# Patient Record
Sex: Male | Born: 1995 | Race: Black or African American | Hispanic: No | Marital: Single | Smoking: Never smoker
Health system: Southern US, Community
[De-identification: ages and names within clinical notes are randomized; demographics above are authoritative.]

## PROBLEM LIST (undated history)

## (undated) DIAGNOSIS — S022XXA Fracture of nasal bones, initial encounter for closed fracture: Secondary | ICD-10-CM

## (undated) DIAGNOSIS — S060XAA Concussion with loss of consciousness status unknown, initial encounter: Secondary | ICD-10-CM

## (undated) DIAGNOSIS — S42009A Fracture of unspecified part of unspecified clavicle, initial encounter for closed fracture: Secondary | ICD-10-CM

## (undated) DIAGNOSIS — S060X9A Concussion with loss of consciousness of unspecified duration, initial encounter: Secondary | ICD-10-CM

## (undated) DIAGNOSIS — G43909 Migraine, unspecified, not intractable, without status migrainosus: Secondary | ICD-10-CM

## (undated) HISTORY — PX: ADENOIDECTOMY: SUR15

---
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
## 2013-02-19 IMAGING — CR DG NASAL BONES 3+V
3 series · 3 of 3 positions shown · non-contrast
Comparison: None.

CLINICAL DATA: Nasal injury.

NASAL BONES - 3+ VIEW

[w waters pa]
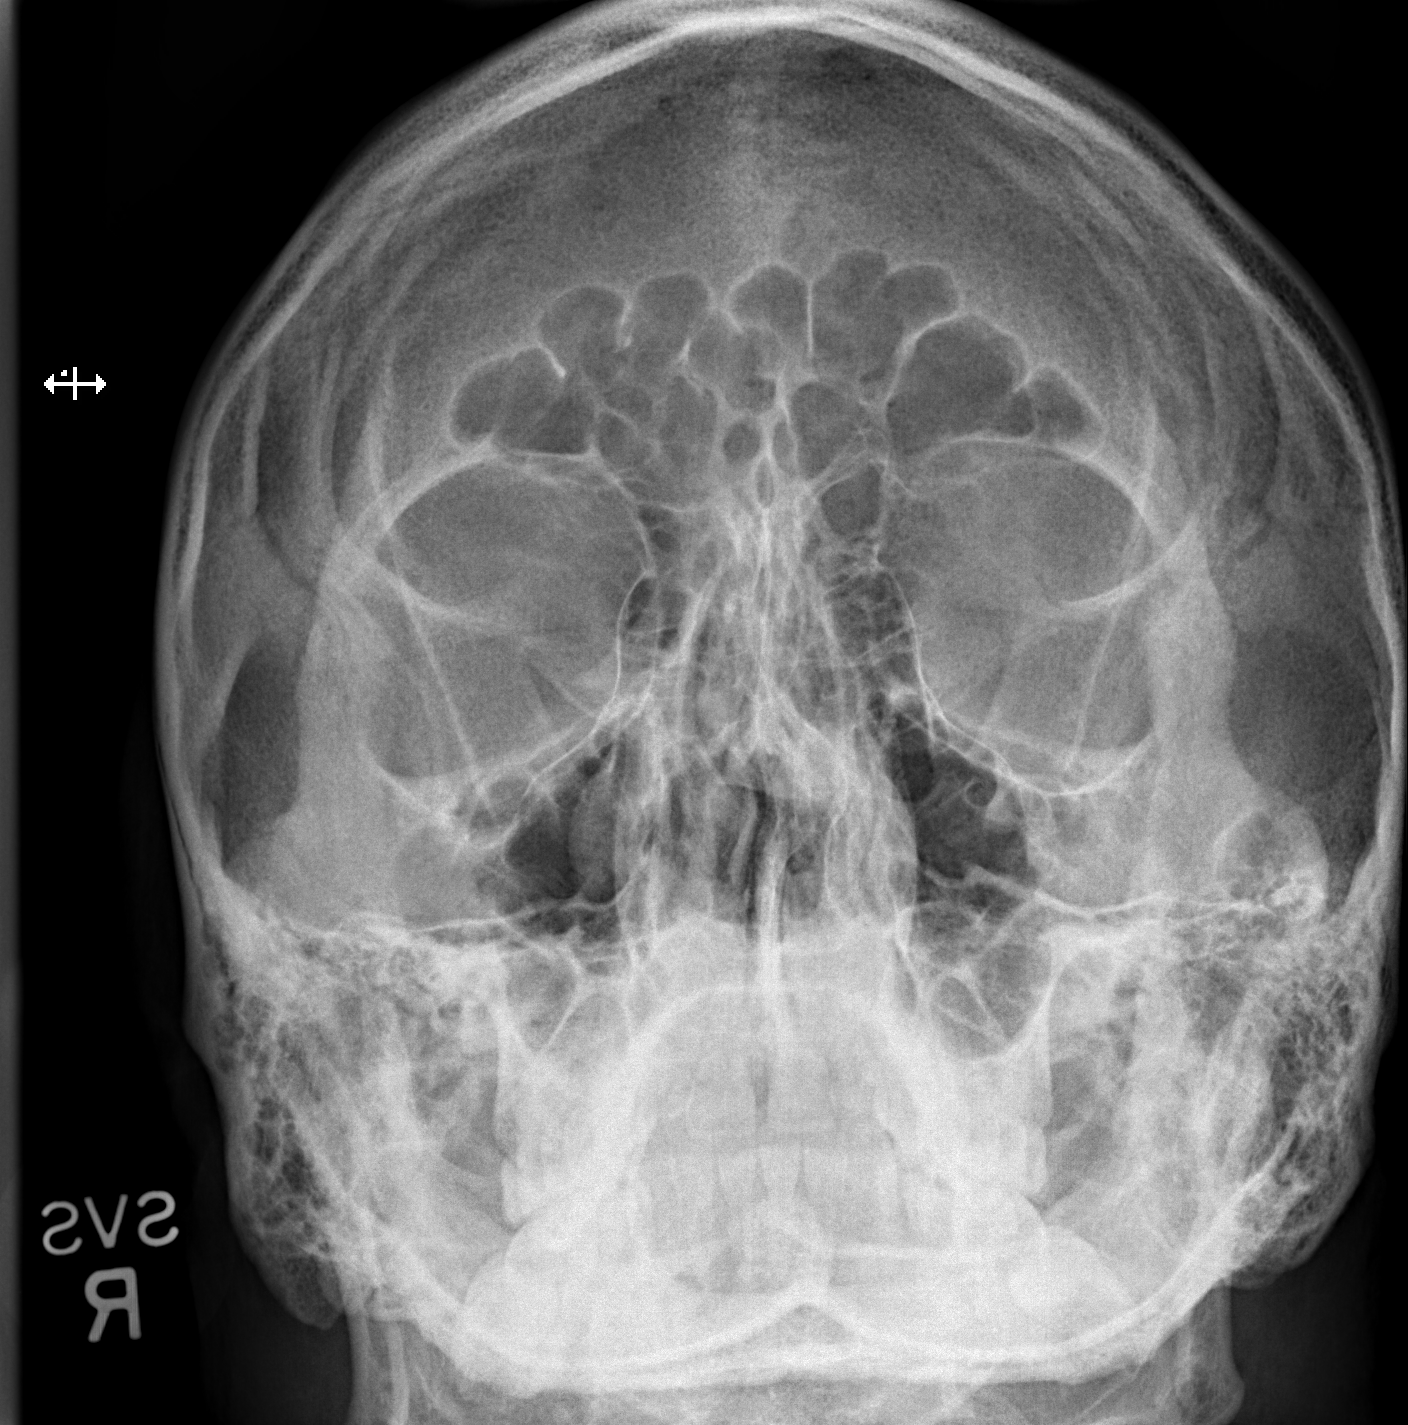

[w nasal bone lat (1 of 2)]
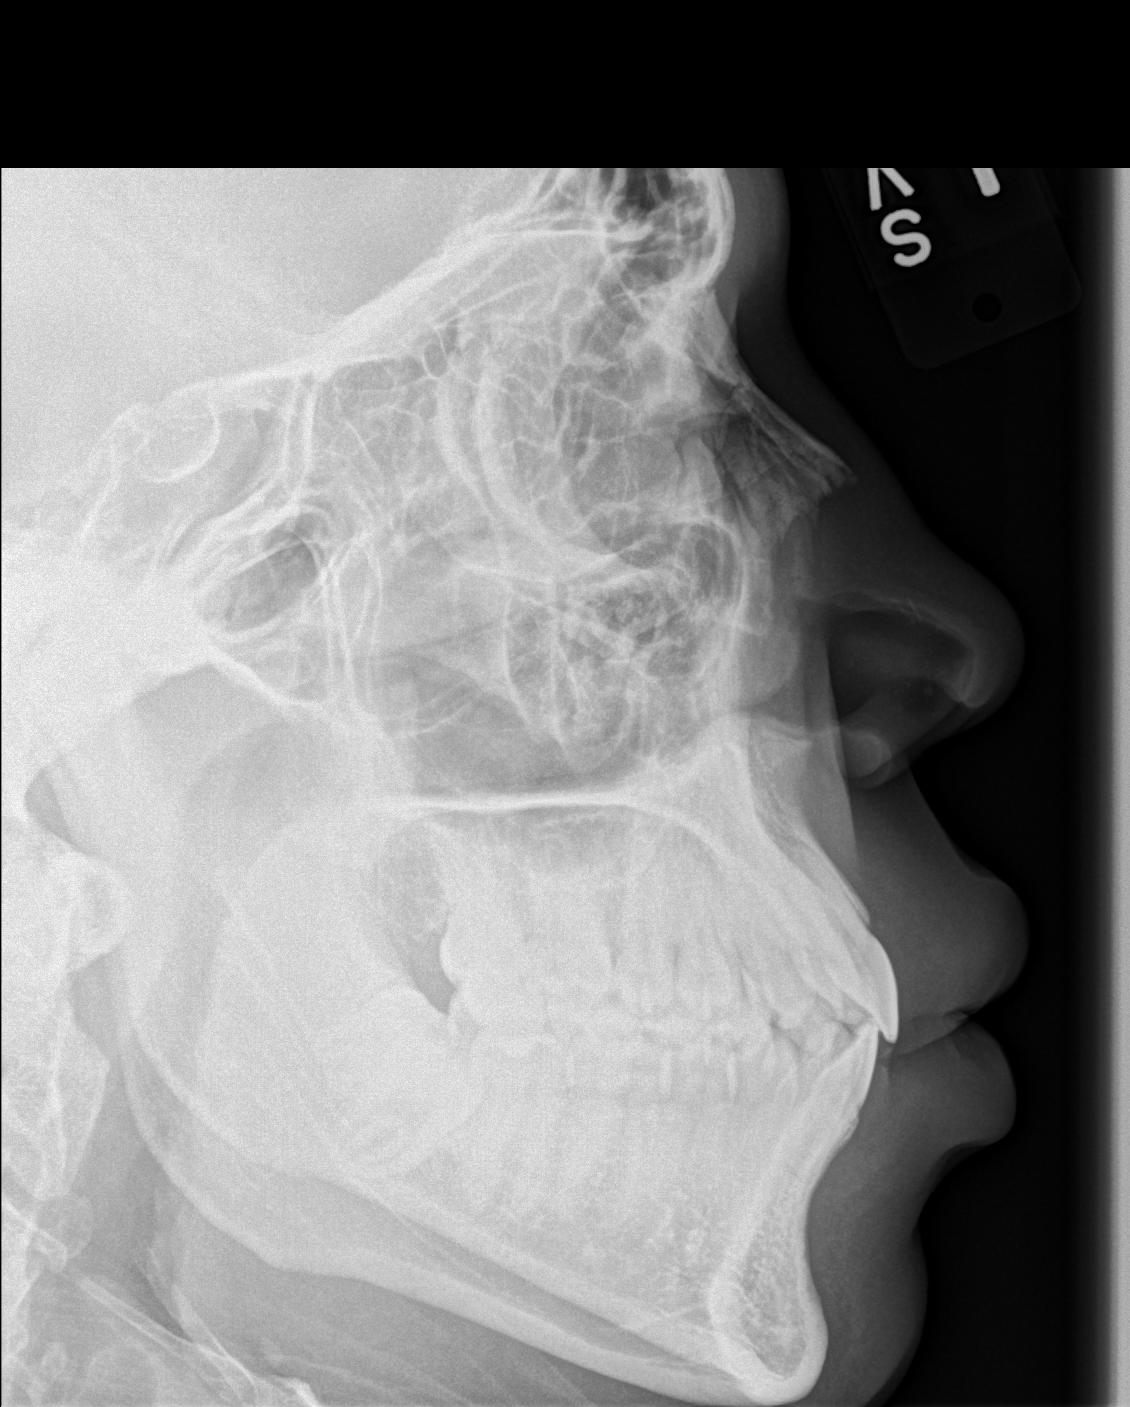

[w nasal bone lat (2 of 2)]
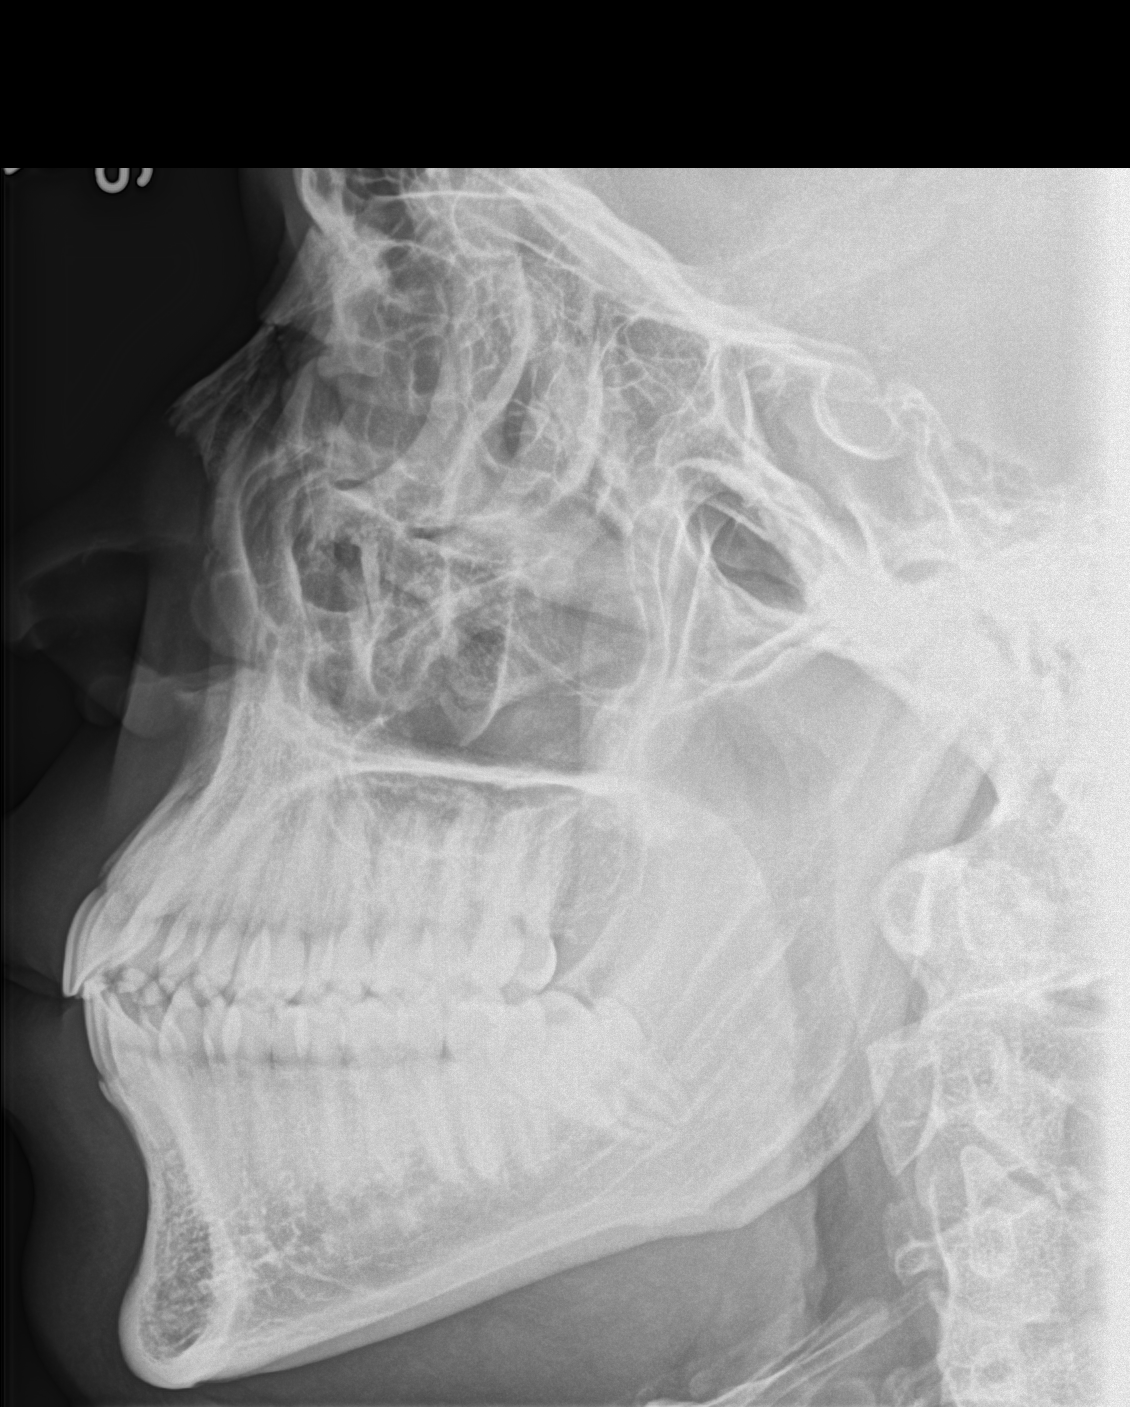

[3 of 3 positions shown; findings below may reference images not displayed]

FINDINGS: Left lateral view shows slight step-off at the bridge of
the nose along the superior margin of the nasal bone.  This may
represents a minimally displaced fracture.
IMPRESSION: Possible minimally displaced upper nasal bone fracture which is
more evident on the left lateral view than the right lateral view.

## 2014-03-18 ENCOUNTER — Encounter (HOSPITAL_COMMUNITY): Payer: Self-pay | Admitting: Emergency Medicine

## 2014-03-18 ENCOUNTER — Emergency Department (HOSPITAL_COMMUNITY)
Admission: EM | Admit: 2014-03-18 | Discharge: 2014-03-18 | Disposition: A | Discharge status: Home / Self Care | Payer: Federal, State, Local not specified - PPO | Attending: Emergency Medicine | Admitting: Emergency Medicine

## 2014-03-18 DIAGNOSIS — L255 Unspecified contact dermatitis due to plants, except food: Secondary | ICD-10-CM

## 2014-03-18 DIAGNOSIS — Z8781 Personal history of (healed) traumatic fracture: Secondary | ICD-10-CM | POA: Insufficient documentation

## 2014-03-18 DIAGNOSIS — Z8679 Personal history of other diseases of the circulatory system: Secondary | ICD-10-CM | POA: Insufficient documentation

## 2014-03-18 DIAGNOSIS — Z87828 Personal history of other (healed) physical injury and trauma: Secondary | ICD-10-CM | POA: Insufficient documentation

## 2014-03-18 HISTORY — DX: Migraine, unspecified, not intractable, without status migrainosus: G43.909

## 2014-03-18 MED ORDER — LORATADINE 10 MG PO TABS: 10.0000 mg | ORAL_TABLET | Freq: Every day | ORAL | Status: DC

## 2014-03-18 MED ORDER — HYDROCORTISONE 1 % EX CREA: TOPICAL_CREAM | CUTANEOUS | Status: DC

## 2014-03-18 NOTE — ED Notes (Addendum)
C/o bilateral medial ankle rash, linear rash noted with redness, swelling & oozing. C/o itching. First noticed Sunday morning. Worked in yard pulling vines Friday afternoon. Here thinking it was an insect bite.

## 2014-03-18 NOTE — Discharge Instructions (Signed)
Poison Ivy °Poison ivy is a rash caused by touching the leaves of the poison ivy plant. The rash often shows up 48 hours later. You might just have bumps, redness, and itching. Sometimes, blisters appear and break open. Your eyes may get puffy (swollen). Poison ivy often heals in 2 to 3 weeks without treatment. °HOME CARE °· If you touch poison ivy: °· Wash your skin with soap and water right away. Wash under your fingernails. Do not rub the skin very hard. °· Wash any clothes you were wearing. °· Avoid poison ivy in the future. Poison ivy has 3 leaves on a stem. °· Use medicine to help with itching as told by your doctor. Do not drive when you take this medicine. °· Keep open sores dry, clean, and covered with a bandage and medicated cream, if needed. °· Ask your doctor about medicine for children. °GET HELP RIGHT AWAY IF: °· You have open sores. °· Redness spreads beyond the area of the rash. °· There is yellowish white fluid (pus) coming from the rash. °· Pain gets worse. °· You have a temperature by mouth above 102° F (38.9° C), not controlled by medicine. °MAKE SURE YOU: °· Understand these instructions. °· Will watch your condition. °· Will get help right away if you are not doing well or get worse. °Document Released: 11/04/2010 Document Revised: 12/25/2011 Document Reviewed: 11/04/2010 °Exit[employer]® Patient Information ©2014 [employer]. ° °

## 2014-03-18 NOTE — ED Notes (Signed)
Pt denies pain but does state that bilat legs are itchy.

## 2014-03-18 NOTE — ED Provider Notes (Signed)
CSN: [id number]     Arrival date & time 03/18/14  2105 History   First MD Initiated Contact with Patient 03/18/14 2212     Chief Complaint  Patient presents with  . Rash   Patient is a 18 y.o. male presenting with rash. The history is provided by the patient. No language interpreter was used.  Rash Location:  Leg Leg rash location:  L lower leg and R lower leg Quality: blistering, draining, itchiness and weeping   Quality: not burning, not painful, not red and not swelling   Severity:  Mild Onset quality:  Gradual Duration:  4 days Timing:  Constant Progression:  Unchanged Context: plant contact   Context: not animal contact, not chemical exposure, not exposure to similar rash, not insect bite/sting, not new detergent/soap, not pollen, not sick contacts and not sun exposure   Relieved by:  Nothing Worsened by:  Nothing tried Ineffective treatments:  None tried Associated symptoms: no fatigue, no fever, no induration, no joint pain, no shortness of breath and not wheezing     Past Medical History  Diagnosis Date  . Concussion   . Broken clavicle   . Nasal fracture   . Migraines    Past Surgical History  Procedure Laterality Date  . Tonsillectomy and adenoidectomy  03/03/2011  . Adenoidectomy      as child  . Closed reduction nasal fracture  09/27/2012    Procedure: CLOSED REDUCTION NASAL FRACTURE;  Surgeon: Osborn Coho, MD;  Location: [facility];  Service: ENT;  Laterality: N/A;   No family history on file. History  Substance Use Topics  . Smoking status: Never Smoker   . Smokeless tobacco: Not on file  . Alcohol Use: No    Review of Systems  Constitutional: Negative for fever, chills and fatigue.  Respiratory: Negative for chest tightness, shortness of breath and wheezing.   Cardiovascular: Negative for chest pain.  Musculoskeletal: Negative for arthralgias.  Skin: Positive for rash. Negative for color change.  All other systems reviewed and are  negative.     Allergies  Review of patient's allergies indicates no known allergies.  Home Medications   Prior to Admission medications   Medication Sig Start Date End Date Taking? Authorizing Provider  ibuprofen (ADVIL,MOTRIN) 200 MG tablet Take 400 mg by mouth every 6 (six) hours as needed. For headache.   Yes Historical Provider, MD   BP 120/68  Pulse 65  Temp(Src) 97.9 F (36.6 C) (Oral)  Resp 20  SpO2 99% Physical Exam  Nursing note and vitals reviewed. Constitutional: He is oriented to person, place, and time. He appears well-developed and well-nourished.  HENT:  Head: Normocephalic and atraumatic.  Eyes: Conjunctivae are normal. No scleral icterus.  Neck: Normal range of motion. No tracheal deviation present.  Cardiovascular: Normal rate, regular rhythm, normal heart sounds and intact distal pulses.  Exam reveals no gallop and no friction rub.   No murmur heard. Pulmonary/Chest: Effort normal and breath sounds normal. No respiratory distress. He has no wheezes. He has no rales. He exhibits no tenderness.  Musculoskeletal: Normal range of motion.  Neurological: He is alert and oriented to person, place, and time.  Skin: Skin is warm and dry.  Multiple vesicles located over the bilateral medial ankle with some ruptured vesicles that are actively weeping a clear non-purulent non-bloody discharge.  Vesicles are on a mildly erythematous base without induration.  Rash has no petchiae, purpura, and blanches with pressure.  It is not noted over  the soles of the feet.  Psychiatric: He has a normal mood and affect. His behavior is normal. Judgment and thought content normal.    ED Course  Procedures (including critical care time) Labs Review Labs Reviewed - No data to display  Imaging Review No results found.   EKG Interpretation None      MDM   Final diagnoses:  Contact dermatitis due to plant    Patient presents with history of rash onset after gardening and  pulling up weeds.  Rash is localized and appears consistent with poison ivy.  Will treat with oral claritin for itching and will use topical hydrocortisone.  Patient was instructed to not itch this rash and to keep it clean and dry.  He can use gauze to cover it as he needs it for weeping.  Strict return precautions of worsening of symptoms, wheezing, fever, or new onset of pain.  Patient understands and agrees with current plan.  Will discharge home.    Clydie Braunourtney A Forucci, PA-C 03/18/14 2237

## 2014-03-19 NOTE — ED Provider Notes (Signed)
Medical screening examination/treatment/procedure(s) were performed by non-physician practitioner and as supervising physician I was immediately available for consultation/collaboration.   EKG Interpretation None        Katesha Eichel S Marit Goodwill, MD 03/19/14 1103 

## 2019-08-08 ENCOUNTER — Other Ambulatory Visit: Payer: Self-pay

## 2019-08-08 DIAGNOSIS — Z20822 Contact with and (suspected) exposure to covid-19: Secondary | ICD-10-CM

## 2019-08-09 LAB — NOVEL CORONAVIRUS, NAA: SARS-CoV-2, NAA: NOT DETECTED

## 2023-03-21 ENCOUNTER — Ambulatory Visit: Payer: 59 | Admitting: Podiatry

## 2023-03-21 ENCOUNTER — Encounter: Payer: Self-pay | Admitting: Podiatry

## 2023-03-21 DIAGNOSIS — L600 Ingrowing nail: Secondary | ICD-10-CM

## 2023-03-21 NOTE — Patient Instructions (Signed)

## 2023-03-22 NOTE — Progress Notes (Signed)
Subjective:   Patient ID: Antonio Ali, male   DOB: 27 y.o.   MRN: [id number]   HPI Patient presents stating he has had a chronic ingrown toenail of his left big toe that is been sore and making it hard for him to wear shoe gear properly.  He has tried to soak it trim it is been present for a number of months   Review of Systems  All other systems reviewed and are negative.       Objective:  Physical Exam Vitals and nursing note reviewed.  Constitutional:      Appearance: He is well-developed.  Pulmonary:     Effort: Pulmonary effort is normal.  Musculoskeletal:        General: Normal range of motion.  Skin:    General: Skin is warm.  Neurological:     Mental Status: He is alert.     Neurovascular status intact muscle strength adequate range of motion adequate with incurvation of the left hallux lateral border that is very painful when pressed and is making wearing shoe gear difficult.  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  Explained procedure risk patient wants surgery and today I went ahead and I had him read then signed consent form.  Infiltrated the left big toe 60 mg Xylocaine Marcaine mixture and sterile prep applied using sterile instrumentation remove the lateral border exposed matrix applied phenol 3 applications 30 seconds followed by alcohol lavage sterile dressing gave instructions on soaks and to wear dressing 24 hours take it off earlier if throbbing were to occur and encouraged him to call questions concerns which may arise

## 2023-11-14 ENCOUNTER — Ambulatory Visit
Admission: EM | Admit: 2023-11-14 | Discharge: 2023-11-14 | Disposition: A | Discharge status: Home / Self Care | Payer: 59 | Attending: Physician Assistant | Admitting: Physician Assistant

## 2023-11-14 DIAGNOSIS — J111 Influenza due to unidentified influenza virus with other respiratory manifestations: Secondary | ICD-10-CM

## 2023-11-14 LAB — POCT INFLUENZA A/B
Influenza A, POC: POSITIVE — AB
Influenza B, POC: NEGATIVE

## 2023-11-14 MED ORDER — ACETAMINOPHEN 325 MG PO TABS
650.0000 mg | ORAL_TABLET | Freq: Once | ORAL | Status: AC
  Administered 2023-11-14: 650 mg via ORAL

## 2023-11-14 NOTE — ED Provider Notes (Signed)
[facility]    CSN: [id number] Arrival date & time: 11/14/23  0854      History   Chief Complaint Chief Complaint  Patient presents with   Cough   Headache   Chest Pain   Fever    HPI Antonio Ali is a 28 y.o. male.   Patient complains of a cough and congestion.  Patient reports he has had a fever.  Patient has been sick for the past 3 days.  Patient denies any shortness of breath.  The history is provided by the patient. No language interpreter was used.  Cough Associated symptoms: chest pain, fever and headaches   Headache Associated symptoms: cough and fever   Chest Pain Associated symptoms: cough, fever and headache   Fever Associated symptoms: chest pain, cough and headaches     Past Medical History:  Diagnosis Date   Broken clavicle    Concussion    Migraines    Nasal fracture     Patient Active Problem List   Diagnosis Date Noted   Nasal fracture 09/27/2012    Class: Acute    Past Surgical History:  Procedure Laterality Date   ADENOIDECTOMY     as child   CLOSED REDUCTION NASAL FRACTURE  09/27/2012   Procedure: CLOSED REDUCTION NASAL FRACTURE;  Surgeon: Osborn Coho, MD;  Location: [facility];  Service: ENT;  Laterality: N/A;   TONSILLECTOMY AND ADENOIDECTOMY  03/03/2011       Home Medications    Prior to Admission medications   Medication Sig Start Date End Date Taking? Authorizing Provider  hydrocortisone cream 1 % Apply to affected area 2 times daily 03/18/14   Shirleen Schirmer, PA-C  ibuprofen (ADVIL,MOTRIN) 200 MG tablet Take 400 mg by mouth every 6 (six) hours as needed. For headache.    [provider]  loratadine (CLARITIN) 10 MG tablet Take 1 tablet (10 mg total) by mouth daily. 03/18/14   Shirleen Schirmer, PA-C    Family History History reviewed. No pertinent family history.  Social History Social History   Tobacco Use   Smoking status: Never  Substance Use Topics   Alcohol use: No    Drug use: No     Allergies   Patient has no known allergies.   Review of Systems Review of Systems  Constitutional:  Positive for fever.  Respiratory:  Positive for cough.   Cardiovascular:  Positive for chest pain.  Neurological:  Positive for headaches.  All other systems reviewed and are negative.    Physical Exam Triage Vital Signs ED Triage Vitals  Encounter Vitals Group     BP 11/14/23 0922 132/85     Systolic BP Percentile --      Diastolic BP Percentile --      Pulse Rate 11/14/23 0922 (!) 101     Resp 11/14/23 0922 19     Temp 11/14/23 0922 (!) 101.2 F (38.4 C)     Temp Source 11/14/23 0922 Oral     SpO2 11/14/23 0922 93 %     Weight --      Height --      Head Circumference --      Peak Flow --      Pain Score 11/14/23 0921 0     Pain Loc --      Pain Education --      Exclude from Growth Chart --    No data found.  Updated Vital Signs BP 132/85   Pulse Marland Kitchen)  101   Temp (!) 101 F (38.3 C) (Oral)   Resp 19   SpO2 93%   Visual Acuity Right Eye Distance:   Left Eye Distance:   Bilateral Distance:    Right Eye Near:   Left Eye Near:    Bilateral Near:     Physical Exam Vitals and nursing note reviewed.  Constitutional:      General: He is not in acute distress.    Appearance: He is well-developed.  HENT:     Head: Normocephalic and atraumatic.  Eyes:     Conjunctiva/sclera: Conjunctivae normal.  Cardiovascular:     Rate and Rhythm: Normal rate and regular rhythm.     Heart sounds: No murmur heard. Pulmonary:     Effort: Pulmonary effort is normal. No respiratory distress.     Breath sounds: Normal breath sounds.  Abdominal:     Palpations: Abdomen is soft.     Tenderness: There is no abdominal tenderness.  Musculoskeletal:        General: No swelling.     Cervical back: Neck supple.  Skin:    General: Skin is warm and dry.     Capillary Refill: Capillary refill takes less than 2 seconds.  Neurological:     Mental Status: He is  alert.  Psychiatric:        Mood and Affect: Mood normal.      UC Treatments / Results  Labs (all labs ordered are listed, but only abnormal results are displayed) Labs Reviewed  POCT INFLUENZA A/B - Abnormal; Notable for the following components:      Result Value   Influenza A, POC Positive (*)    All other components within normal limits    EKG   Radiology No results found.  Procedures Procedures (including critical care time)  Medications Ordered in UC Medications  acetaminophen (TYLENOL) tablet 650 mg (650 mg Oral Given 11/14/23 0931)    Initial Impression / Assessment and Plan / UC Course  I have reviewed the triage vital signs and the nursing notes.  Pertinent labs & imaging results that were available during my care of the patient were reviewed by me and considered in my medical decision making (see chart for details).     Influenza A is positive Final Clinical Impressions(s) / UC Diagnoses   Final diagnoses:  Influenza with respiratory manifestation     Discharge Instructions      Return if any problems.     ED Prescriptions   None    PDMP not reviewed this encounter. An After Visit Summary was printed and given to the patient.       Elson Areas, [place] 11/14/23 1040

## 2023-11-14 NOTE — Discharge Instructions (Signed)
Return if any problems.

## 2023-11-14 NOTE — ED Triage Notes (Signed)
Pt presents with c/o headaches, fatigue.  Reports he developed cough, fever and chest congestion X 3 days.

## 2024-10-12 ENCOUNTER — Ambulatory Visit: Admission: EM | Admit: 2024-10-12 | Discharge: 2024-10-12 | Disposition: A | Discharge status: Home / Self Care

## 2024-10-12 DIAGNOSIS — K529 Noninfective gastroenteritis and colitis, unspecified: Secondary | ICD-10-CM

## 2024-10-12 LAB — POCT INFLUENZA A/B: Influenza A, POC: NEGATIVE

## 2024-10-12 LAB — POC SOFIA SARS ANTIGEN FIA: SARS Coronavirus 2 Ag: NEGATIVE

## 2024-10-12 MED ORDER — ONDANSETRON 8 MG PO TBDP: 8.0000 mg | ORAL_TABLET | Freq: Three times a day (TID) | ORAL | 0 refills | Status: DC | PRN

## 2024-10-12 NOTE — Discharge Instructions (Addendum)

## 2024-10-12 NOTE — ED Provider Notes (Signed)
 " [facility]    CSN: [id number] Arrival date & time: 10/12/24  1028      History   Chief Complaint No chief complaint on file.   HPI Antonio Ali is a 28 y.o. male.   Pt presents today due to nausea, vomiting, and diarrhea that started on Sunday, 12 hrs after eating at his girlfriend's Christmas party. Pt states that he was doing better by Christmas. Pt states that he drank coffee yesterday and believe that caused him to experience chills and stomach upset again. Pt denies any URI symptoms. Pt denies vomiting or diarrhea in the last 2 days either. Pt states that he use Pepto Bismol last night with some relief.   The history is provided by the patient.    Past Medical History:  Diagnosis Date   Broken clavicle    Concussion    Migraines    Nasal fracture     Patient Active Problem List   Diagnosis Date Noted   Nasal fracture 09/27/2012    Class: Acute    Past Surgical History:  Procedure Laterality Date   ADENOIDECTOMY     as child   CLOSED REDUCTION NASAL FRACTURE  09/27/2012   Procedure: CLOSED REDUCTION NASAL FRACTURE;  Surgeon: Alm Bouche, MD;  Location: [facility];  Service: ENT;  Laterality: N/A;   TONSILLECTOMY AND ADENOIDECTOMY  03/03/2011       Home Medications    Prior to Admission medications  Medication Sig Start Date End Date Taking? Authorizing Provider  hydrocortisone  cream 1 % Apply to affected area 2 times daily 03/18/14   Aniceto Pfeiffer, PA-C  ibuprofen (ADVIL,MOTRIN) 200 MG tablet Take 400 mg by mouth every 6 (six) hours as needed. For headache.    [provider]  loratadine  (CLARITIN ) 10 MG tablet Take 1 tablet (10 mg total) by mouth daily. 03/18/14   Aniceto Pfeiffer, PA-C    Family History No family history on file.  Social History Social History[1]   Allergies   Patient has no known allergies.   Review of Systems Review of Systems   Physical Exam Triage Vital Signs ED Triage  Vitals  Encounter Vitals Group     BP 10/12/24 1235 133/88     Girls Systolic BP Percentile --      Girls Diastolic BP Percentile --      Boys Systolic BP Percentile --      Boys Diastolic BP Percentile --      Pulse Rate 10/12/24 1235 94     Resp 10/12/24 1235 20     Temp 10/12/24 1235 99.5 F (37.5 C)     Temp Source 10/12/24 1235 Oral     SpO2 10/12/24 1235 100 %     Weight 10/12/24 1233 260 lb (117.9 kg)     Height 10/12/24 1233 6' (1.829 m)     Head Circumference --      Peak Flow --      Pain Score 10/12/24 1233 1     Pain Loc --      Pain Education --      Exclude from Growth Chart --    No data found.  Updated Vital Signs BP 133/88 (BP Location: Left Arm)   Pulse 94   Temp 99.5 F (37.5 C) (Oral)   Resp 20   Ht 6' (1.829 m)   Wt 260 lb (117.9 kg)   SpO2 100%   BMI 35.26 kg/m   Visual Acuity Right Eye  Distance:   Left Eye Distance:   Bilateral Distance:    Right Eye Near:   Left Eye Near:    Bilateral Near:     Physical Exam Vitals and nursing note reviewed.  Constitutional:      General: He is not in acute distress.    Appearance: Normal appearance. He is not ill-appearing, toxic-appearing or diaphoretic.  Eyes:     General: No scleral icterus. Cardiovascular:     Rate and Rhythm: Normal rate and regular rhythm.     Heart sounds: Normal heart sounds.  Pulmonary:     Effort: Pulmonary effort is normal. No respiratory distress.     Breath sounds: Normal breath sounds. No wheezing or rhonchi.  Abdominal:     General: Abdomen is flat. Bowel sounds are normal.     Palpations: Abdomen is soft.     Tenderness: There is no abdominal tenderness.  Skin:    General: Skin is warm.  Neurological:     Mental Status: He is alert and oriented to person, place, and time.  Psychiatric:        Mood and Affect: Mood normal.        Behavior: Behavior normal.      UC Treatments / Results  Labs (all labs ordered are listed, but only abnormal results are  displayed) Labs Reviewed  POCT INFLUENZA A/B  POC SOFIA SARS ANTIGEN FIA    EKG   Radiology No results found.  Procedures Procedures (including critical care time)  Medications Ordered in UC Medications - No data to display  Initial Impression / Assessment and Plan / UC Course  I have reviewed the triage vital signs and the nursing notes.  Pertinent labs & imaging results that were available during my care of the patient were reviewed by me and considered in my medical decision making (see chart for details).    Final Clinical Impressions(s) / UC Diagnoses   Final diagnoses:  None   Discharge Instructions   None    ED Prescriptions   None    PDMP not reviewed this encounter.    [1]  Social History Tobacco Use   Smoking status: Never  Substance Use Topics   Alcohol use: No   Drug use: Yes    Types: Marijuana    Comment: THC gummies     Andra Corean BROCKS, PA-C 10/12/24 1305  "

## 2024-10-12 NOTE — ED Triage Notes (Signed)
 Antonio Ali presents with chills, headache, diarrhea earlier this week. Treated with Pepto, anti gas and diarrhea and Tylenol  Pm. I was feeling better for 2 days.

## 2024-10-19 ENCOUNTER — Ambulatory Visit
Admission: RE | Admit: 2024-10-19 | Discharge: 2024-10-19 | Disposition: A | Discharge status: Home / Self Care | Source: Ambulatory Visit | Attending: Internal Medicine | Admitting: Internal Medicine

## 2024-10-19 VITALS — BP 154/96 | HR 67 | Temp 98.0°F | Resp 18

## 2024-10-19 DIAGNOSIS — M79604 Pain in right leg: Secondary | ICD-10-CM | POA: Diagnosis not present

## 2024-10-19 DIAGNOSIS — M7989 Other specified soft tissue disorders: Secondary | ICD-10-CM

## 2024-10-19 NOTE — ED Triage Notes (Signed)
 Pt began have pain in right groin last Sunday then moved to right knee and now pain is in right calf. Pt is having pain in right calf, swelling, redness, and warmth.   Denies any injury

## 2024-10-19 NOTE — ED Provider Notes (Signed)
 [facility]    CSN: [id number] Arrival date & time: 10/19/24  9161      History   Chief Complaint Chief Complaint  Patient presents with   Ankle Pain    My lower left leg is swollen from the knee down. It's tender and hurt when touch. - Entered by patient    HPI Antonio Ali is a 29 y.o. male.   29 year old male who presents urgent care with complaints of right lower extremity pain and swelling.  He reports that last Sunday he did start to have some pain in his groin.  This slowly progressed to more pain in his knee and in the last 2 to 3 days he has developed swelling that is just below the knee.  He has having a lot of pain in the calf as well.  He denies any history of this in the past.  He is not having any swelling on the left side.  He denies any recent surgeries, significant illnesses, clotting disorders or other risk factors for deep vein thrombosis.   Ankle Pain Associated symptoms: no back pain and no fever     Past Medical History:  Diagnosis Date   Broken clavicle    Concussion    Migraines    Nasal fracture     Patient Active Problem List   Diagnosis Date Noted   Nasal fracture 09/27/2012    Class: Acute    Past Surgical History:  Procedure Laterality Date   ADENOIDECTOMY     as child   CLOSED REDUCTION NASAL FRACTURE  09/27/2012   Procedure: CLOSED REDUCTION NASAL FRACTURE;  Surgeon: Alm Bouche, MD;  Location: [facility];  Service: ENT;  Laterality: N/A;   TONSILLECTOMY AND ADENOIDECTOMY  03/03/2011       Home Medications    Prior to Admission medications  Medication Sig Start Date End Date Taking? Authorizing Provider  hydrocortisone  cream 1 % Apply to affected area 2 times daily 03/18/14   Aniceto Pfeiffer, PA-C  ibuprofen (ADVIL,MOTRIN) 200 MG tablet Take 400 mg by mouth every 6 (six) hours as needed. For headache.    [provider]  loratadine  (CLARITIN ) 10 MG tablet Take 1 tablet (10 mg total) by  mouth daily. 03/18/14   Aniceto Pfeiffer, PA-C  ondansetron  (ZOFRAN -ODT) 8 MG disintegrating tablet Take 1 tablet (8 mg total) by mouth every 8 (eight) hours as needed. 10/12/24   Andra Corean BROCKS, PA-C    Family History History reviewed. No pertinent family history.  Social History Social History[1]   Allergies   Patient has no known allergies.   Review of Systems Review of Systems  Constitutional:  Negative for chills and fever.  HENT:  Negative for ear pain and sore throat.   Eyes:  Negative for pain and visual disturbance.  Respiratory:  Negative for cough and shortness of breath.   Cardiovascular:  Negative for chest pain and palpitations.  Gastrointestinal:  Negative for abdominal pain and vomiting.  Genitourinary:  Negative for dysuria and hematuria.  Musculoskeletal:  Negative for arthralgias and back pain.       Right lower extremity pain and swelling  Skin:  Negative for color change and rash.  Neurological:  Negative for seizures and syncope.  All other systems reviewed and are negative.    Physical Exam Triage Vital Signs ED Triage Vitals  Encounter Vitals Group     BP 10/19/24 0859 (!) 154/96     Girls Systolic BP Percentile --  Girls Diastolic BP Percentile --      Boys Systolic BP Percentile --      Boys Diastolic BP Percentile --      Pulse Rate 10/19/24 0859 67     Resp 10/19/24 0859 18     Temp 10/19/24 0859 98 F (36.7 C)     Temp Source 10/19/24 0859 Oral     SpO2 10/19/24 0859 95 %     Weight --      Height --      Head Circumference --      Peak Flow --      Pain Score 10/19/24 0906 5     Pain Loc --      Pain Education --      Exclude from Growth Chart --    No data found.  Updated Vital Signs BP (!) 154/96 (BP Location: Right Arm)   Pulse 67   Temp 98 F (36.7 C) (Oral)   Resp 18   SpO2 95%   Visual Acuity Right Eye Distance:   Left Eye Distance:   Bilateral Distance:    Right Eye Near:   Left Eye Near:     Bilateral Near:     Physical Exam Vitals and nursing note reviewed.  Constitutional:      General: He is not in acute distress.    Appearance: He is well-developed.  HENT:     Head: Normocephalic and atraumatic.  Eyes:     Conjunctiva/sclera: Conjunctivae normal.  Cardiovascular:     Rate and Rhythm: Normal rate and regular rhythm.     Pulses:          Dorsalis pedis pulses are 2+ on the right side.       Posterior tibial pulses are 2+ on the right side.     Heart sounds: No murmur heard.    Comments: Tenderness to palpation in the calf on the right lower extremity Pulmonary:     Effort: Pulmonary effort is normal. No respiratory distress.     Breath sounds: Normal breath sounds.  Abdominal:     Palpations: Abdomen is soft.     Tenderness: There is no abdominal tenderness.  Musculoskeletal:        General: No swelling.     Cervical back: Neck supple.     Right lower leg: 2+ Edema present.  Skin:    General: Skin is warm and dry.     Capillary Refill: Capillary refill takes less than 2 seconds.  Neurological:     Mental Status: He is alert.  Psychiatric:        Mood and Affect: Mood normal.      UC Treatments / Results  Labs (all labs ordered are listed, but only abnormal results are displayed) Labs Reviewed - No data to display  EKG   Radiology No results found.  Procedures Procedures (including critical care time)  Medications Ordered in UC Medications - No data to display  Initial Impression / Assessment and Plan / UC Course  I have reviewed the triage vital signs and the nursing notes.  Pertinent labs & imaging results that were available during my care of the patient were reviewed by me and considered in my medical decision making (see chart for details).     Pain and swelling of right lower extremity   Right lower extremity pain and swelling.  Presentation is concerning for possible deep vein thrombosis although risk factors are very low  including no recent surgeries,  significant illnesses or clotting disorders.  Given this we will schedule for an ultrasound tomorrow and pending these results, further treatment recommendations will be made.  If there is a deep vein thrombosis (blood clot) then anticoagulant therapy will be started (blood thinners).  If no deep vein thrombosis is present, then may need to consider infectious process as possible cause although this seems to be less likely.  For now, monitor your symptoms and if you develop shortness of breath or chest pain recommend going to the emergency room immediately for further evaluation and not waiting till the ultrasound tomorrow.  Instructions for the ultrasound are attached.  Keep the leg elevated to help with swelling.  Final Clinical Impressions(s) / UC Diagnoses   Final diagnoses:  Pain and swelling of right lower extremity     Discharge Instructions      Right lower extremity pain and swelling.  Presentation is concerning for possible deep vein thrombosis although risk factors are very low including no recent surgeries, significant illnesses or clotting disorders.  Given this we will schedule for an ultrasound tomorrow and pending these results, further treatment recommendations will be made.  If there is a deep vein thrombosis (blood clot) then anticoagulant therapy will be started (blood thinners).  If no deep vein thrombosis is present, then may need to consider infectious process as possible cause although this seems to be less likely.  For now, monitor your symptoms and if you develop shortness of breath or chest pain recommend going to the emergency room immediately for further evaluation and not waiting till the ultrasound tomorrow.  Instructions for the ultrasound are attached.  Keep the leg elevated to help with swelling.    ED Prescriptions   None    PDMP not reviewed this encounter.    [1]  Social History Tobacco Use   Smoking status: Never   Substance Use Topics   Alcohol use: No   Drug use: Yes    Types: Marijuana    Comment: THC gummies     Teresa Almarie LABOR, PA-C 10/19/24 0935  "

## 2024-10-19 NOTE — Discharge Instructions (Addendum)
 Right lower extremity pain and swelling.  Presentation is concerning for possible deep vein thrombosis although risk factors are very low including no recent surgeries, significant illnesses or clotting disorders.  Given this we will schedule for an ultrasound tomorrow and pending these results, further treatment recommendations will be made.  If there is a deep vein thrombosis (blood clot) then anticoagulant therapy will be started (blood thinners).  If no deep vein thrombosis is present, then may need to consider infectious process as possible cause although this seems to be less likely.  For now, monitor your symptoms and if you develop shortness of breath or chest pain recommend going to the emergency room immediately for further evaluation and not waiting till the ultrasound tomorrow.  Instructions for the ultrasound are attached.  Keep the leg elevated to help with swelling.

## 2024-10-20 ENCOUNTER — Ambulatory Visit (HOSPITAL_COMMUNITY)
Admission: RE | Admit: 2024-10-20 | Discharge: 2024-10-20 | Disposition: A | Discharge status: Home / Self Care | Source: Ambulatory Visit | Attending: Internal Medicine | Admitting: Internal Medicine

## 2024-10-20 ENCOUNTER — Ambulatory Visit: Payer: Self-pay

## 2024-10-20 DIAGNOSIS — R609 Edema, unspecified: Secondary | ICD-10-CM | POA: Diagnosis not present

## 2024-10-20 DIAGNOSIS — M79604 Pain in right leg: Secondary | ICD-10-CM

## 2024-10-20 DIAGNOSIS — M79661 Pain in right lower leg: Secondary | ICD-10-CM | POA: Diagnosis present

## 2024-10-20 NOTE — Telephone Encounter (Signed)
 FYI Only or Action Required?: FYI only for provider: appointment scheduled on 10/23/24.  Patient was last seen in primary care on N/A.  Called Nurse Triage reporting Leg Swelling.  Symptoms began several weeks ago.  Interventions attempted: Nothing.  Symptoms are: unchanged.  Triage Disposition: See Physician Within 24 Hours  Patient/caregiver understands and will follow disposition?:   Reason for Disposition  [1] MODERATE leg swelling (e.g., swelling extends up to knees) AND [2] new-onset or getting worse  Answer Assessment - Initial Assessment Questions Pt reports Right leg swelling, red, and warm to the touch, 3/10 pain.  Went to UC and was sent to vascular center this morning 10/20/24 - DVT scan found no blood clots. Pt by UC to establish care with a HCP.   10/04/25 had stomach virus and since then had swelling from knee to ankle.  Pt scheduled with HCP 10/23/24.  1. ONSET: When did the swelling start? (e.g., minutes, hours, days)     10/04/25 2. LOCATION: What part of the leg is swollen?  Are both legs swollen or just one leg?     Right leg - knee to ankle 3. SEVERITY: How bad is the swelling? (e.g., localized; mild, moderate, severe)     Mild  4. REDNESS: Is there redness or signs of infection?     yes 5. PAIN: Is the swelling painful to touch? If Yes, ask: How painful is it?   (Scale 1-10; mild, moderate or severe)     3/10 6. FEVER: Do you have a fever? If Yes, ask: What is it, how was it measured, and when did it start?      Denies  7. CAUSE: What do you think is causing the leg swelling?     Possible infection  8. MEDICAL HISTORY: Do you have a history of blood clots (e.g., DVT), cancer, heart failure, kidney disease, or liver failure?     Denies  9. RECURRENT SYMPTOM: Have you had leg swelling before? If Yes, ask: When was the last time? What happened that time?     Denies  10. OTHER SYMPTOMS: Do you have any other symptoms? (e.g.,  chest pain, difficulty breathing)       Denies  Protocols used: Leg Swelling and Edema-A-AH  Copied from CRM #[id number]. Topic: Clinical - Red Word Triage >> Oct 20, 2024 10:07 AM Treva T wrote: Red Word that prompted transfer to Nurse Triage: Pt calling, reported he is having lower extremity right leg swelling.   States it is tender to the touch, right leg swelling is from knee down to ankles and is tender to the touch.  Requesting an appt as a new pt as soon as possible.   CB# [phone]

## 2024-10-21 ENCOUNTER — Ambulatory Visit (HOSPITAL_COMMUNITY): Payer: Self-pay

## 2024-10-23 ENCOUNTER — Ambulatory Visit (HOSPITAL_BASED_OUTPATIENT_CLINIC_OR_DEPARTMENT_OTHER)

## 2024-10-23 ENCOUNTER — Encounter (HOSPITAL_BASED_OUTPATIENT_CLINIC_OR_DEPARTMENT_OTHER): Payer: Self-pay

## 2024-10-23 VITALS — BP 137/94 | HR 84 | Ht 72.0 in | Wt 273.3 lb

## 2024-10-23 DIAGNOSIS — R197 Diarrhea, unspecified: Secondary | ICD-10-CM

## 2024-10-23 DIAGNOSIS — Z7689 Persons encountering health services in other specified circumstances: Secondary | ICD-10-CM | POA: Diagnosis not present

## 2024-10-23 DIAGNOSIS — I739 Peripheral vascular disease, unspecified: Secondary | ICD-10-CM

## 2024-10-23 NOTE — Progress Notes (Signed)
 "   New Patient Office Visit  Subjective:   Antonio Ali [birth year] 10/23/2024  Chief Complaint  Patient presents with   New Patient (Initial Visit)    Patient is here to get established with PCP. Patient states his right leg has been swelling on and off for about 2 weeks. Has been having consistent diarrhea on and off for 3 weeks.    HPI: Antonio Ali presents today to establish care at [facility] at [facility]. Introduced to publishing rights manager role and practice setting with verbalized understanding by patient.  All questions answered.   Last PCP: unsure Last annual physical: unsure Concerns: See below   Right leg swelling: States he has had pain from his groin to his shin. States it began to swell as well. Had DVT done which was negative. Denies current pain just intermittent swelling and redness.   Diarrhea:  Started in December after a stomach bug. States since then it has been on and off to where he will go to the bathroom and have diarrhea. Denies abdominal pain or bloody stool.    The following portions of the patient's history were reviewed and updated as appropriate: past medical history, past surgical history, family history, social history, allergies, medications, and problem list.   Patient Active Problem List   Diagnosis Date Noted   Nasal fracture 09/27/2012    Class: Acute   Past Medical History:  Diagnosis Date   Broken clavicle    Concussion    Migraines    Nasal fracture    Past Surgical History:  Procedure Laterality Date   ADENOIDECTOMY     as child   CLOSED REDUCTION NASAL FRACTURE  09/27/2012   Procedure: CLOSED REDUCTION NASAL FRACTURE;  Surgeon: Alm Bouche, MD;  Location: [facility];  Service: ENT;  Laterality: N/A;   TONSILLECTOMY AND ADENOIDECTOMY  03/03/2011   History reviewed. No pertinent family history. Social History   Socioeconomic History   Marital status: Single     Spouse name: Not on file   Number of children: Not on file   Years of education: Not on file   Highest education level: Not on file  Occupational History   Not on file  Tobacco Use   Smoking status: Never   Smokeless tobacco: Not on file  Substance and Sexual Activity   Alcohol use: No   Drug use: Yes    Types: Marijuana    Comment: THC gummies   Sexual activity: Never  Other Topics Concern   Not on file  Social History Narrative   Not on file   Social Drivers of Health   Tobacco Use: Unknown (10/23/2024)   Patient History    Smoking Tobacco Use: Never    Smokeless Tobacco Use: Unknown    Passive Exposure: Not on file  Financial Resource Strain: Low Risk (10/23/2024)   Overall Financial Resource Strain (CARDIA)    Difficulty of Paying Living Expenses: Not hard at all  Food Insecurity: No Food Insecurity (10/23/2024)   Epic    Worried About Radiation Protection Practitioner of Food in the Last Year: Never true    Ran Out of Food in the Last Year: Never true  Transportation Needs: No Transportation Needs (10/23/2024)   Epic    Lack of Transportation (Medical): No    Lack of Transportation (Non-Medical): No  Physical Activity: Inactive (10/23/2024)   Exercise Vital Sign    Days of Exercise per Week: 0 days  Minutes of Exercise per Session: 0 min  Stress: No Stress Concern Present (10/23/2024)   [employer] of Occupational Health - Occupational Stress Questionnaire    Feeling of Stress: Not at all  Social Connections: Socially Isolated (10/23/2024)   Social Connection and Isolation Panel    Frequency of Communication with Friends and Family: Three times a week    Frequency of Social Gatherings with Friends and Family: Once a week    Attends Religious Services: Never    Database Administrator or Organizations: No    Attends Banker Meetings: Never    Marital Status: Never married  Intimate Partner Violence: Not At Risk (10/23/2024)   Epic    Fear of Current or Ex-Partner: No     Emotionally Abused: No    Physically Abused: No    Sexually Abused: No  Depression (PHQ2-9): Low Risk (10/23/2024)   Depression (PHQ2-9)    PHQ-2 Score: 0  Alcohol Screen: Low Risk (10/23/2024)   Alcohol Screen    Last Alcohol Screening Score (AUDIT): 1  Housing: Low Risk (10/23/2024)   Epic    Unable to Pay for Housing in the Last Year: No    Number of Times Moved in the Last Year: 0    Homeless in the Last Year: No  Utilities: Not At Risk (10/23/2024)   Epic    Threatened with loss of utilities: No  Health Literacy: Adequate Health Literacy (10/23/2024)   B1300 Health Literacy    Frequency of need for help with medical instructions: Never   Outpatient Medications Prior to Visit  Medication Sig Dispense Refill   aspirin-acetaminophen -caffeine (EXCEDRIN EXTRA STRENGTH) 250-250-65 MG tablet Take 2 tablets by mouth every 6 (six) hours as needed for headache.     hydrocortisone  cream 1 % Apply to affected area 2 times daily (Patient not taking: Reported on 10/23/2024) 15 g 0   ibuprofen (ADVIL,MOTRIN) 200 MG tablet Take 400 mg by mouth every 6 (six) hours as needed. For headache. (Patient not taking: Reported on 10/23/2024)     loratadine  (CLARITIN ) 10 MG tablet Take 1 tablet (10 mg total) by mouth daily. (Patient not taking: Reported on 10/23/2024) 20 tablet 0   ondansetron  (ZOFRAN -ODT) 8 MG disintegrating tablet Take 1 tablet (8 mg total) by mouth every 8 (eight) hours as needed. (Patient not taking: Reported on 10/23/2024) 20 tablet 0   No facility-administered medications prior to visit.   Allergies[1]  ROS: A complete ROS was performed with pertinent positives/negatives noted in the HPI. The remainder of the ROS are negative.   Objective:   Today's Vitals   10/23/24 1455  BP: (!) 137/94  Pulse: 84  SpO2: 95%  Weight: 273 lb 4.8 oz (124 kg)  Height: 6' (1.829 m)    GENERAL: Well-appearing, in NAD. Well nourished. RESPIRATORY: Chest wall symmetrical. Respirations even and non-labored.  Breath sounds clear to auscultation bilaterally.  CARDIAC: S1, S2 present, regular rate and rhythm without murmur or gallops. Peripheral pulses 2+ bilaterally.  MSK: Muscle tone and strength appropriate for age. Joints w/o tenderness, redness, or swelling.  EXTREMITIES: pulses 3+ bilaterally. Temperature equal in lower extremities.  RLE: cap refill > 3 seconds, mild edema, mild discoloration to shin GI: normoactive. No tenderness to palpation. NEUROLOGIC: No motor or sensory deficits. Steady, even gait. C2-C12 intact.  PSYCH/MENTAL STATUS: Alert, oriented x 3. Cooperative, appropriate mood and affect.    Health Maintenance Due  Topic Date Due   HIV Screening  Never done  Hepatitis C Screening  Never done   DTaP/Tdap/Td (1 - Tdap) Never done   Hepatitis B Vaccines 19-59 Average Risk (1 of 3 - 19+ 3-dose series) Never done   HPV VACCINES (1 - Risk 3-dose SCDM series) Never done   COVID-19 Vaccine (1 - 2025-26 season) Never done    No results found for any visits on 10/23/24.     Assessment & Plan:  1. Encounter to establish care with new doctor Discussed role of NP and expectations of the Primary Care Clinic. Discussed medical, surgical, and family history.   2. Peripheral vascular disease (Primary) Findings today suggest to be vascular in nature. Pulses and temperature equal in both legs. However, with difference in color notable around his shin as well as decreased cap refill I believe him to have some type of PVD. I placed a referral at today's visit and also discussed red flag symptoms in which pt verbalized understanding of when to be seen in ER.  - Ambulatory referral to Vascular Surgery  3. Diarrhea, unspecified type I believe this lingering diarrhea to be due to the acute gastritis he experienced less than a month ago. I discussed the use of probiotics as well as eating bland foods for the next week to two weeks to help promote the formation of healthy bacteria in his gut. I  shared red flag symptoms and when to be seen in ER and pt verbalized understanding.   Patient to reach out to office if new, worrisome, or unresolved symptoms arise or if no improvement in patient's condition. Patient verbalized understanding and is agreeable to treatment plan. All questions answered to patient's satisfaction.    Return in about 4 weeks (around 11/20/2024) for annual physical (fasting labs day of).    Lauraine Almarie Angus DNP, FNP-C     [1] No Known Allergies  "

## 2024-10-28 ENCOUNTER — Other Ambulatory Visit: Payer: Self-pay | Admitting: Vascular Surgery

## 2024-10-28 DIAGNOSIS — M7989 Other specified soft tissue disorders: Secondary | ICD-10-CM

## 2024-10-30 ENCOUNTER — Ambulatory Visit: Admitting: Vascular Surgery

## 2024-10-30 ENCOUNTER — Ambulatory Visit (HOSPITAL_COMMUNITY)
Admission: RE | Admit: 2024-10-30 | Discharge: 2024-10-30 | Disposition: A | Discharge status: Home / Self Care | Source: Ambulatory Visit | Attending: Surgery | Admitting: Surgery

## 2024-10-30 ENCOUNTER — Other Ambulatory Visit: Payer: Self-pay | Admitting: Vascular Surgery

## 2024-10-30 ENCOUNTER — Encounter: Payer: Self-pay | Admitting: Vascular Surgery

## 2024-10-30 VITALS — BP 128/84 | HR 67 | Temp 98.1°F | Resp 20 | Ht 72.0 in | Wt 279.6 lb

## 2024-10-30 DIAGNOSIS — M7989 Other specified soft tissue disorders: Secondary | ICD-10-CM | POA: Insufficient documentation

## 2024-10-30 DIAGNOSIS — L819 Disorder of pigmentation, unspecified: Secondary | ICD-10-CM | POA: Diagnosis not present

## 2024-10-30 NOTE — Progress Notes (Signed)
 " Office Note     CC: Right lower extremity edema Requesting Provider:  Gari Lauraine BRAVO, FNP  HPI: Antonio Ali is a 29 y.o. ([birth year]) male presenting at the request of .Gari Lauraine BRAVO, FNP right lower extremity edema.  On exam, Antonio Ali was doing well.  Antonio Ali Antonio Ali, he graduated from going over high school.  Antonio Ali lives an active lifestyle.  He loves to snowboard.  Notes that he is overweight, and working on losing 50 pounds this year to be at a healthier spot.  Over a month ago, had a virus and noted significant right lower extremity edema from the ankle to the knee.  Fortunately, this resolved without issue.  He presents today asymptomatic.  Notes no issues with ambulation.  Denies claudication, ischemic rest pain, tissue loss.  Past Medical History:  Diagnosis Date   Broken clavicle    Concussion    Migraines    Nasal fracture     Past Surgical History:  Procedure Laterality Date   ADENOIDECTOMY     as child   CLOSED REDUCTION NASAL FRACTURE  09/27/2012   Procedure: CLOSED REDUCTION NASAL FRACTURE;  Surgeon: Alm Bouche, MD;  [facility];  Service: ENT;  Laterality: N/A;   TONSILLECTOMY AND ADENOIDECTOMY  03/03/2011    Social History   Socioeconomic History   Marital status: Single    Spouse name: Not on file   Number of children: Not on file   Years of education: Not on file   Highest education level: Not on file  Occupational History   Not on file  Tobacco Use   Smoking status: Never   Smokeless tobacco: Not on file  Substance and Sexual Activity   Alcohol use: No   Drug use: Yes    Types: Marijuana    Comment: THC gummies   Sexual activity: Never  Other Topics Concern   Not on file  Social History Narrative   Not on file   Social Drivers of Health   Tobacco Use: Unknown (10/23/2024)   Patient History    Smoking Tobacco Use: Never    Smokeless Tobacco Use: Unknown    Passive Exposure: Not on file  Financial Resource  Strain: Low Risk (10/23/2024)   Overall Financial Resource Strain (CARDIA)    Difficulty of Paying Living Expenses: Not hard at all  Food Insecurity: No Food Insecurity (10/23/2024)   Epic    Worried About Programme Researcher, Broadcasting/film/video in the Last Year: Never true    Ran Out of Food in the Last Year: Never true  Transportation Needs: No Transportation Needs (10/23/2024)   Epic    Lack of Transportation (Medical): No    Lack of Transportation (Non-Medical): No  Physical Activity: Inactive (10/23/2024)   Exercise Vital Sign    Days of Exercise per Week: 0 days    Minutes of Exercise per Session: 0 min  Stress: No Stress Concern Present (10/23/2024)   [employer] of Occupational Health - Occupational Stress Questionnaire    Feeling of Stress: Not at all  Social Connections: Socially Isolated (10/23/2024)   Social Connection and Isolation Panel    Frequency of Communication with Friends and Family: Three times a week    Frequency of Social Gatherings with Friends and Family: Once a week    Attends Religious Services: Never    Database Administrator or Organizations: No    Attends Banker Meetings: Never    Marital Status: Never married  Intimate  Partner Violence: Not At Risk (10/23/2024)   Epic    Fear of Current or Ex-Partner: No    Emotionally Abused: No    Physically Abused: No    Sexually Abused: No  Depression (PHQ2-9): Low Risk (10/23/2024)   Depression (PHQ2-9)    PHQ-2 Score: 0  Alcohol Screen: Low Risk (10/23/2024)   Alcohol Screen    Last Alcohol Screening Score (AUDIT): 1  Housing: Low Risk (10/23/2024)   Epic    Unable to Pay for Housing in the Last Year: No    Number of Times Moved in the Last Year: 0    Homeless in the Last Year: No  Utilities: Not At Risk (10/23/2024)   Epic    Threatened with loss of utilities: No  Health Literacy: Adequate Health Literacy (10/23/2024)   B1300 Health Literacy    Frequency of need for help with medical instructions: Never  No family  history on file.  Current Outpatient Medications  Medication Sig Dispense Refill   aspirin-acetaminophen -caffeine (EXCEDRIN EXTRA STRENGTH) 250-250-65 MG tablet Take 2 tablets by mouth every 6 (six) hours as needed for headache.     No current facility-administered medications for this visit.    Allergies[1]   REVIEW OF SYSTEMS:  [X]  denotes positive finding, [ ]  denotes negative finding Cardiac  Comments:  Chest pain or chest pressure:    Shortness of breath upon exertion:    Short of breath when lying flat:    Irregular heart rhythm:        Vascular    Pain in calf, thigh, or hip brought on by ambulation:    Pain in feet at night that wakes you up from your sleep:     Blood clot in your veins:    Leg swelling:         Pulmonary    Oxygen at home:    Productive cough:     Wheezing:         Neurologic    Sudden weakness in arms or legs:     Sudden numbness in arms or legs:     Sudden onset of difficulty speaking or slurred speech:    Temporary loss of vision in one eye:     Problems with dizziness:         Gastrointestinal    Blood in stool:     Vomited blood:         Genitourinary    Burning when urinating:     Blood in urine:        Psychiatric    Major depression:         Hematologic    Bleeding problems:    Problems with blood clotting too easily:        Skin    Rashes or ulcers:        Constitutional    Fever or chills:      PHYSICAL EXAMINATION:  There were no vitals filed for this visit.  General:  WDWN in NAD; vital signs documented above Gait: Not observed HENT: WNL, normocephalic Pulmonary: normal non-labored breathing , without wheezing Cardiac: regular HR Abdomen: soft, NT, no masses Skin: without rashes Vascular Exam/Pulses:  Right Left  Radial 2+ (normal) 2+ (normal)  Ulnar    Femoral    Popliteal    DP 2+ (normal) 2+ (normal)  PT     Extremities: without ischemic changes, without Gangrene , without cellulitis; without open  wounds;  Musculoskeletal: no muscle wasting or atrophy  Neurologic: A&O X 3;  No focal weakness or paresthesias are detected Psychiatric:  The pt has Normal affect.   Non-Invasive Vascular Imaging:   See study    ASSESSMENT/PLAN: Antonio Ali is a 29 y.o. male presenting with transient swelling of the right lower extremity from the ankle to the knee.  This resolved without issue.  On exam, Antonio Ali had no swelling.  He had palpable pulses in the feet.  ABIs were reviewed.  These were normal.  I had a long conversation with Antonio Ali regarding the above.  He does not have peripheral arterial disease.  No DVT.  No concern for lymphedema.  I do not have an etiology for his swelling.  Recommended that he continue to live an active lifestyle and work on weight loss, as sometimes obesity can lead to lower extremity edema.    Fonda FORBES Rim, MD [facility] [phone]     [1] No Known Allergies  "

## 2024-11-20 ENCOUNTER — Ambulatory Visit (INDEPENDENT_AMBULATORY_CARE_PROVIDER_SITE_OTHER)

## 2024-11-20 VITALS — BP 134/91 | HR 88 | Ht 72.0 in | Wt 280.1 lb

## 2024-11-20 DIAGNOSIS — Z0000 Encounter for general adult medical examination without abnormal findings: Secondary | ICD-10-CM

## 2024-11-20 DIAGNOSIS — R0981 Nasal congestion: Secondary | ICD-10-CM

## 2024-11-20 DIAGNOSIS — G479 Sleep disorder, unspecified: Secondary | ICD-10-CM | POA: Diagnosis not present

## 2024-11-20 NOTE — Progress Notes (Signed)
 "  Subjective:   Antonio Ali 1996-01-11 11/20/2024  CC: Chief Complaint  Patient presents with   Annual Exam    Patient is here for his annual physical. Patient states he would like blood work done to check vitamin levels.     HPI: Antonio Ali is a 29 y.o. male who presents for a routine health maintenance exam.  Labs collected at time of visit.   Discussed the use of AI scribe software for clinical note transcription with the patient, who gave verbal consent to proceed.  History of Present Illness Sleep Disturbance: He has experienced broken sleep for several years, typically waking around 2 AM after going to bed at 8:30 PM. He struggles to return to sleep after waking. He has tried melatonin, starting at 5 mg and increasing to 25 mg, but found it ineffective and experienced grogginess upon waking. He has not established a bedtime routine and aims to be in bed before 10 PM. His significant other has noted that he struggles with breathing during sleep. He knows this is related to his weight in which he  Patient would like to check lab work prior to any other interventions such as a sleep study or medication.  History of Diarrhea: He reports a change in his bowel movement timing since starting a probiotic, with bowel movements now occurring at 6:30 AM instead of between 4:30 and 5:30 AM. He no longer experiences diarrhea and notes an improvement in his sleep quality since taking the probiotic.  History of Leg Swelling: Has no longer experience this after his last appointment. Was cleared by vascular surgery with no acute concerns.  Congestion: States his congestion started Sunday. Patient is taking sudafed in which he reports that improving his symptoms. Is not taking a nasal spray.    HEALTH SCREENINGS: - Vision Screening: up to date - Dental Visits: needs list of local dental offices - Testicular Exam: Not applicable - STD Screening: Not applicable - PSA (50+): Not  applicable  No results found for: PSA1, PSA  - Colonoscopy (45+): Not applicable  Discussed with patient purpose of the colonoscopy is to detect colon cancer at curable precancerous or early stages  - AAA Screening: Done elsewhere  Men age 33-75 who have ever smoked - Lung Cancer screening with low-dose CT: Not applicable-  Adults age 19-80 who are current cigarette smokers or quit within the last 15 years. Must have 20 pack year history.   Depression and Anxiety Screen done today and results listed below:     10/23/2024    3:03 PM  Depression screen PHQ 2/9  Decreased Interest 0  Down, Depressed, Hopeless 0  PHQ - 2 Score 0       No data to display          IMMUNIZATIONS:  - Tdap: Tetanus vaccination status reviewed: patient refused. - Influenza: Refused - Pneumovax: Not applicable - Prevnar: Not applicable - Shingrix vaccine (50+): Not applicable   Past medical history, surgical history, medications, allergies, family history and social history reviewed with patient today and changes made to appropriate areas of the chart.   Past Medical History:  Diagnosis Date   Broken clavicle    Concussion    Migraines    Nasal fracture     Past Surgical History:  Procedure Laterality Date   ADENOIDECTOMY     as child   CLOSED REDUCTION NASAL FRACTURE  09/27/2012   Procedure: CLOSED REDUCTION NASAL FRACTURE;  Surgeon: Alm Bouche, MD;  Location: [facility];  Service: ENT;  Laterality: N/A;   COSMETIC SURGERY     TONSILLECTOMY AND ADENOIDECTOMY  03/03/2011    Current Outpatient Medications on File Prior to Visit  Medication Sig   aspirin-acetaminophen -caffeine (EXCEDRIN EXTRA STRENGTH) 250-250-65 MG tablet Take 2 tablets by mouth every 6 (six) hours as needed for headache.   No current facility-administered medications on file prior to visit.    Allergies[1]   Social History   Socioeconomic History   Marital status: Single    Spouse name:  Not on file   Number of children: Not on file   Years of education: Not on file   Highest education level: Associate degree: occupational, scientist, product/process development, or vocational program  Occupational History   Not on file  Tobacco Use   Smoking status: Some Days    Types: Cigarettes   Smokeless tobacco: Not on file  Vaping Use   Vaping status: Never Used  Substance and Sexual Activity   Alcohol use: Yes    Alcohol/week: 1.0 standard drink of alcohol    Types: 1 Cans of beer per week   Drug use: Yes    Frequency: 3.0 times per week    Types: Marijuana    Comment: THC gummies   Sexual activity: Yes    Birth control/protection: None  Other Topics Concern   Not on file  Social History Narrative   Not on file   Social Drivers of Health   Tobacco Use: High Risk (11/20/2024)   Patient History    Smoking Tobacco Use: Some Days    Smokeless Tobacco Use: Unknown    Passive Exposure: Not on file  Financial Resource Strain: Low Risk (11/20/2024)   Overall Financial Resource Strain (CARDIA)    Difficulty of Paying Living Expenses: Not hard at all  Food Insecurity: No Food Insecurity (11/20/2024)   Epic    Worried About Radiation Protection Practitioner of Food in the Last Year: Never true    Ran Out of Food in the Last Year: Never true  Transportation Needs: No Transportation Needs (11/20/2024)   Epic    Lack of Transportation (Medical): No    Lack of Transportation (Non-Medical): No  Physical Activity: Insufficiently Active (11/20/2024)   Exercise Vital Sign    Days of Exercise per Week: 2 days    Minutes of Exercise per Session: 60 min  Stress: Stress Concern Present (11/20/2024)   [employer] of Occupational Health - Occupational Stress Questionnaire    Feeling of Stress: Very much  Social Connections: Socially Isolated (11/20/2024)   Social Connection and Isolation Panel    Frequency of Communication with Friends and Family: More than three times a week    Frequency of Social Gatherings with Friends and Family:  Once a week    Attends Religious Services: Never    Database Administrator or Organizations: No    Attends Engineer, Structural: Not on file    Marital Status: Never married  Intimate Partner Violence: Not At Risk (10/23/2024)   Epic    Fear of Current or Ex-Partner: No    Emotionally Abused: No    Physically Abused: No    Sexually Abused: No  Depression (PHQ2-9): Low Risk (10/23/2024)   Depression (PHQ2-9)    PHQ-2 Score: 0  Alcohol Screen: Low Risk (11/20/2024)   Alcohol Screen    Last Alcohol Screening Score (AUDIT): 3  Housing: High Risk (11/20/2024)   Epic    Unable to Pay for Housing in  the Last Year: Yes    Number of Times Moved in the Last Year: 0    Homeless in the Last Year: No  Utilities: Not At Risk (10/23/2024)   Epic    Threatened with loss of utilities: No  Health Literacy: Adequate Health Literacy (10/23/2024)   B1300 Health Literacy    Frequency of need for help with medical instructions: Never   Tobacco Use History[2] Social History   Substance and Sexual Activity  Alcohol Use Yes   Alcohol/week: 1.0 standard drink of alcohol   Types: 1 Cans of beer per week    History reviewed. No pertinent family history.   ROS: Denies fever, fatigue, unexplained weight loss/gain, CP, SHOB, and palpatitations. Denies neurological deficits, gastrointestinal and/or genitourinary complaints, and skin changes.   Objective:   Today's Vitals   11/20/24 0912  BP: (!) 134/91  Pulse: 88  SpO2: 97%  Weight: 280 lb 1.6 oz (127.1 kg)  Height: 6' (1.829 m)    GENERAL APPEARANCE: Well-appearing, in NAD. Well nourished.  SKIN: Pink, warm and dry. Turgor normal. No rash, lesion, ulceration, or ecchymoses. Hair evenly distributed.  HEENT: HEAD: Normocephalic.  EYES: PERRLA. EOMI. Lids intact w/o defect. Sclera white, Conjunctiva pink w/o exudate.  EARS: External ear w/o redness, swelling, masses or lesions. EAC clear. TM's intact, translucent w/o bulging, appropriate  landmarks visualized. Appropriate acuity to conversational tones.  NOSE: Septum midline w/o deformity. Nares patent, mucosa red and inflamed w/o drainage. No sinus tenderness.  THROAT: Uvula midline. Oropharynx clear. Tonsils non-inflamed w/o exudate. Oral mucosa pink and moist.  NECK: Supple, Trachea midline. Full ROM w/o pain or tenderness. No lymphadenopathy. Thyroid non-tender w/o enlargement or palpable masses.  RESPIRATORY: Chest wall symmetrical w/o masses. Respirations even and non-labored. Breath sounds clear to auscultation bilaterally. No wheezes, rales, rhonchi, or crackles. CARDIAC: S1, S2 present, regular rate and rhythm. No gallops, murmurs, rubs, or clicks. PMI w/o lifts, heaves, or thrills. No carotid bruits. Capillary refill <2 seconds. Peripheral pulses 2+ bilaterally GI: Abdomen soft w/o distention. Normoactive bowel sounds. No palpable masses or tenderness. No guarding or rebound tenderness. Liver and spleen w/o tenderness or enlargement. No CVA tenderness.  GU: Pt deferred exam. MSK: Muscle tone and strength appropriate for age, w/o atrophy or abnormal movement. EXTREMITIES: Active ROM intact, w/o tenderness, crepitus, or contracture. No obvious joint deformities or effusions. No clubbing, edema, or cyanosis.  NEUROLOGIC: CN's II-XII intact. Motor strength symmetrical with no obvious weakness. No sensory deficits. DTR 2+ symmetric bilaterally. Steady, even gait.  PSYCH/MENTAL STATUS: Alert, oriented x 3. Cooperative, appropriate mood and affect.     Assessment & Plan:  1. Annual physical exam (Primary) Discussed preventative screenings, vaccines, lab work, and healthy lifestyle with patient.  Patient declines routine vaccines. We discussed the risk of this. Patient verbalized understanding and acceptance of risk.  Patient knows they can change their mind at any time and we will be happy to coordinate these things for them.  - CBC with Differential/Platelet - Lipid panel -  Magnesium - TSH - Vitamin B12 - VITAMIN D 25 Hydroxy (Vit-D Deficiency, Fractures) - Hemoglobin A1c - Comprehensive metabolic panel with GFR  2. Sleep difficulties Discussed ordering lab work today to see if any deficiency could be the cause of his sleep disturbances. Discussed follow up with results as well as potential referrals or medications to help with symptom. Pt verbalized understanding.  3. Nasal sinus congestion Discussed starting a nasal spray twice daily to help with congestion. Otherwise continue  symptom management.      PATIENT COUNSELING: - Encouraged to adjust caloric intake to maintain or achieve ideal body weight, to reduce intake of dietary saturated fat and total fat, to limit sodium intake by avoiding high sodium foods and not adding table salt, and to maintain adequate dietary potassium and calcium preferably from fresh fruits, vegetables, and low-fat dairy products.   - Advised to avoid cigarette smoking. - Discussed with the patient that most people either abstain from alcohol or drink within safe limits (<=14/week and <=4 drinks/occasion for males, <=7/weeks and <= 3 drinks/occasion for females) and that the risk for alcohol disorders and other health effects rises proportionally with the number of drinks per week and how often a drinker exceeds daily limits. - Discussed cessation/primary prevention of drug use and availability of treatment for abuse.  - Stressed the importance of regular exercise - Injury prevention: Discussed safety belts, safety helmets, smoke detector, smoking near bedding or upholstery.  - Dental health: Discussed importance of regular tooth brushing, flossing, and dental visits.  - Sexuality: Discussed sexually transmitted diseases, partner selection, use of condoms, avoidance of unintended pregnancy  and contraceptive alternatives.   NEXT PREVENTATIVE PHYSICAL DUE IN 1 YEAR.  Return in about 1 year (around 11/20/2025) for annual physical  (fasting labs same day).  Patient to reach out to office if new, worrisome, or unresolved symptoms arise or if no improvement in patient's condition. Patient verbalized understanding and is agreeable to treatment plan. All questions answered to patient's satisfaction.    Lauraine Almarie Angus DNP, FNP-C    [1] No Known Allergies [2]  Social History Tobacco Use  Smoking Status Some Days   Types: Cigarettes  Smokeless Tobacco Not on file   "

## 2024-11-21 ENCOUNTER — Ambulatory Visit (HOSPITAL_BASED_OUTPATIENT_CLINIC_OR_DEPARTMENT_OTHER): Payer: Self-pay

## 2024-11-21 LAB — CBC WITH DIFFERENTIAL/PLATELET
Basophils Absolute: 0 10*3/uL (ref 0.0–0.2)
Basos: 1 %
EOS (ABSOLUTE): 0.5 10*3/uL — ABNORMAL HIGH (ref 0.0–0.4)
Eos: 7 %
Hematocrit: 46 % (ref 37.5–51.0)
Hemoglobin: 14.9 g/dL (ref 13.0–17.7)
Immature Grans (Abs): 0 10*3/uL (ref 0.0–0.1)
Immature Granulocytes: 0 %
Lymphocytes Absolute: 1.8 10*3/uL (ref 0.7–3.1)
Lymphs: 26 %
MCH: 28.3 pg (ref 26.6–33.0)
MCHC: 32.4 g/dL (ref 31.5–35.7)
MCV: 87 fL (ref 79–97)
Monocytes Absolute: 0.8 10*3/uL (ref 0.1–0.9)
Monocytes: 11 %
Neutrophils Absolute: 3.9 10*3/uL (ref 1.4–7.0)
Neutrophils: 55 %
Platelets: 383 10*3/uL (ref 150–450)
RBC: 5.27 x10E6/uL (ref 4.14–5.80)
RDW: 12.8 % (ref 11.6–15.4)
WBC: 7 10*3/uL (ref 3.4–10.8)

## 2024-11-21 LAB — COMPREHENSIVE METABOLIC PANEL WITH GFR
ALT: 47 [IU]/L — ABNORMAL HIGH (ref 0–44)
AST: 36 [IU]/L (ref 0–40)
Albumin: 4.5 g/dL (ref 4.3–5.2)
Alkaline Phosphatase: 78 [IU]/L (ref 47–123)
BUN/Creatinine Ratio: 14 (ref 9–20)
BUN: 13 mg/dL (ref 6–20)
Bilirubin Total: 0.6 mg/dL (ref 0.0–1.2)
CO2: 21 mmol/L (ref 20–29)
Calcium: 10 mg/dL (ref 8.7–10.2)
Chloride: 100 mmol/L (ref 96–106)
Creatinine, Ser: 0.96 mg/dL (ref 0.76–1.27)
Globulin, Total: 3.4 g/dL (ref 1.5–4.5)
Glucose: 132 mg/dL — ABNORMAL HIGH (ref 70–99)
Potassium: 5 mmol/L (ref 3.5–5.2)
Sodium: 134 mmol/L (ref 134–144)
Total Protein: 7.9 g/dL (ref 6.0–8.5)
eGFR: 109 mL/min/{1.73_m2}

## 2024-11-21 LAB — MAGNESIUM: Magnesium: 2 mg/dL (ref 1.6–2.3)

## 2024-11-21 LAB — VITAMIN B12: Vitamin B-12: 853 pg/mL (ref 232–1245)

## 2024-11-21 LAB — LIPID PANEL
Chol/HDL Ratio: 6.4 ratio — ABNORMAL HIGH (ref 0.0–5.0)
Cholesterol, Total: 172 mg/dL (ref 100–199)
HDL: 27 mg/dL — ABNORMAL LOW
LDL Chol Calc (NIH): 100 mg/dL — ABNORMAL HIGH (ref 0–99)
Triglycerides: 265 mg/dL — ABNORMAL HIGH (ref 0–149)
VLDL Cholesterol Cal: 45 mg/dL — ABNORMAL HIGH (ref 5–40)

## 2024-11-21 LAB — HEMOGLOBIN A1C
Est. average glucose Bld gHb Est-mCnc: 128 mg/dL
Hgb A1c MFr Bld: 6.1 % — ABNORMAL HIGH (ref 4.8–5.6)

## 2024-11-21 LAB — TSH: TSH: 1.12 u[IU]/mL (ref 0.450–4.500)

## 2024-11-21 LAB — VITAMIN D 25 HYDROXY (VIT D DEFICIENCY, FRACTURES): Vit D, 25-Hydroxy: 12.2 ng/mL — ABNORMAL LOW (ref 30.0–100.0)

## 2025-11-23 ENCOUNTER — Encounter (HOSPITAL_BASED_OUTPATIENT_CLINIC_OR_DEPARTMENT_OTHER)
